# Patient Record
Sex: Male | Born: 1990 | Race: White | Hispanic: No | Marital: Single | Smoking: Never smoker
Health system: Southern US, Community
[De-identification: ages and names within clinical notes are randomized; demographics above are authoritative.]

## PROBLEM LIST (undated history)

## (undated) DIAGNOSIS — J45909 Unspecified asthma, uncomplicated: Secondary | ICD-10-CM

## (undated) DIAGNOSIS — T7840XA Allergy, unspecified, initial encounter: Secondary | ICD-10-CM

## (undated) DIAGNOSIS — Z973 Presence of spectacles and contact lenses: Secondary | ICD-10-CM

## (undated) DIAGNOSIS — M1990 Unspecified osteoarthritis, unspecified site: Secondary | ICD-10-CM

## (undated) DIAGNOSIS — K760 Fatty (change of) liver, not elsewhere classified: Secondary | ICD-10-CM

## (undated) HISTORY — DX: Fatty (change of) liver, not elsewhere classified: K76.0

## (undated) HISTORY — DX: Unspecified asthma, uncomplicated: J45.909

## (undated) HISTORY — DX: Presence of spectacles and contact lenses: Z97.3

## (undated) HISTORY — DX: Unspecified osteoarthritis, unspecified site: M19.90

## (undated) HISTORY — DX: Allergy, unspecified, initial encounter: T78.40XA

---

## 2015-04-16 ENCOUNTER — Ambulatory Visit (INDEPENDENT_AMBULATORY_CARE_PROVIDER_SITE_OTHER): Payer: Managed Care, Other (non HMO) | Admitting: Medical

## 2015-04-16 ENCOUNTER — Encounter: Payer: Self-pay | Admitting: Medical

## 2015-04-16 VITALS — BP 150/100 | HR 98 | Ht 69.75 in | Wt 287.0 lb

## 2015-04-16 DIAGNOSIS — R195 Other fecal abnormalities: Secondary | ICD-10-CM | POA: Diagnosis not present

## 2015-04-16 DIAGNOSIS — R748 Abnormal levels of other serum enzymes: Secondary | ICD-10-CM

## 2015-04-16 DIAGNOSIS — E669 Obesity, unspecified: Secondary | ICD-10-CM | POA: Diagnosis not present

## 2015-04-16 DIAGNOSIS — R7301 Impaired fasting glucose: Secondary | ICD-10-CM | POA: Diagnosis not present

## 2015-04-16 DIAGNOSIS — R12 Heartburn: Secondary | ICD-10-CM | POA: Diagnosis not present

## 2015-04-16 DIAGNOSIS — R1011 Right upper quadrant pain: Secondary | ICD-10-CM

## 2015-04-16 LAB — CBC
HCT: 44.2 % (ref 39.0–52.0)
Hemoglobin: 15.6 g/dL (ref 13.0–17.0)
MCH: 32.2 pg (ref 26.0–34.0)
MCHC: 35.3 g/dL (ref 30.0–36.0)
MCV: 91.3 fL (ref 78.0–100.0)
MPV: 9.8 fL (ref 8.6–12.4)
Platelets: 185 10*3/uL (ref 150–400)
RBC: 4.84 MIL/uL (ref 4.22–5.81)
RDW: 13 % (ref 11.5–15.5)
WBC: 6.3 10*3/uL (ref 4.0–10.5)

## 2015-04-16 LAB — HEMOGLOBIN A1C
Hgb A1c MFr Bld: 5.5 % (ref <5.7)
Mean Plasma Glucose: 111 mg/dL (ref <117)

## 2015-04-16 NOTE — Progress Notes (Signed)
Subjective: Chief Complaint  Patient presents with  . New Patient (Initial Visit)    GERD, and abdominal pain. not born in [place]. declines flu shot.    Here as a new patient today.   Moved to [facility] 08/2013 for work.   Working with [employer], does work with wetlands and remediations.  Was seeing [facility] partner in health in [place] prior.   Over the last few months gets bloating, diarrhea, heart burning.  But lately having some pain under right rib and worse heartburn.   Have some diarrhea and pain at times in lower abdomen.   His mother had gall bladder disease.  He is worried about this.  He has taken ranitidine in the past in the teens.   In the past week has felt daily intermittent pains within 30-60 minutes after eating.    Usually right upper abdomen pain.   Denies nausea, vomiting, no fever, no back or shoulder pain.  Has had some increased belching.   Heartburn can be later in the day.  Has ball in throat kind of pain.   Diet - not as good as he can.   Does eat fast and fried food.  No other aggravating or relieving factors.  Had physical in July and liver tests were elevated.   In the past BP has been in the 130/80-90s.  He thinks today's elevate BP is being at a new doctor office  No other complaint.   No past medical history on file.  ROS as in subjective  Objective: BP 150/100 mmHg  Pulse 98  Ht 5' 9.75" (1.772 m)  Wt 287 lb (130.182 kg)  BMI 41.46 kg/m2  General appearance: alert, no distress, WD/WN, obese white male Oral cavity: MMM, no lesions Neck: supple, no lymphadenopathy, no thyromegaly, no masses, no JVD Heart: RRR, normal S1, S2, no murmurs Lungs: CTA bilaterally, no wheezes, rhonchi, or rales Abdomen: +bs, soft, mild right sided tenderness and LUQ tenderness, otherwise  non tender, non distended, no masses, no hepatomegaly, no splenomegaly Pulses: 2+ symmetric, upper and lower extremities, normal cap refill Back:  nontender   Assessment: Encounter Diagnoses  Name Primary?  . RUQ abdominal pain Yes  . Heartburn   . Loose stools   . Elevated liver enzymes   . Impaired fasting blood sugar   . Obesity     Plan: Discussed possible etiologies including galls stones, gall bladder disease, GERD, gastritis, pancreatitis.  discussed 08/2014 labs showing mildly elevated LFTs, impaired glucose.  Additional labs today.  discussed avoidance of foods that trigger gall bladder and GERD, c/t ranitidine, and begin trial of samples of Dexilant.  In general discussed obesity, need for more careful diet, routine exercise.  F/u pending labs, likely RUQ ultrasound.

## 2015-04-17 LAB — COMPREHENSIVE METABOLIC PANEL
ALT: 83 U/L — ABNORMAL HIGH (ref 9–46)
AST: 32 U/L (ref 10–40)
Albumin: 4.5 g/dL (ref 3.6–5.1)
Alkaline Phosphatase: 82 U/L (ref 40–115)
BUN: 13 mg/dL (ref 7–25)
CO2: 28 mmol/L (ref 20–31)
Calcium: 9.1 mg/dL (ref 8.6–10.3)
Chloride: 101 mmol/L (ref 98–110)
Creat: 0.99 mg/dL (ref 0.60–1.35)
Glucose, Bld: 112 mg/dL — ABNORMAL HIGH (ref 65–99)
Potassium: 4 mmol/L (ref 3.5–5.3)
Sodium: 138 mmol/L (ref 135–146)
Total Bilirubin: 0.5 mg/dL (ref 0.2–1.2)
Total Protein: 7 g/dL (ref 6.1–8.1)

## 2015-04-17 LAB — LIPASE: Lipase: 15 U/L (ref 7–60)

## 2015-04-17 MED ORDER — DEXLANSOPRAZOLE 60 MG PO CPDR: 60.0000 mg | DELAYED_RELEASE_CAPSULE | Freq: Every day | ORAL | Status: DC

## 2015-04-17 NOTE — Addendum Note (Signed)
Addended by: Jac Canavan on: 04/17/2015 07:36 AM   Modules accepted: Orders

## 2015-04-18 ENCOUNTER — Telehealth: Payer: Self-pay | Admitting: Medical

## 2015-04-19 ENCOUNTER — Encounter: Payer: Self-pay | Admitting: Medical

## 2015-04-20 ENCOUNTER — Ambulatory Visit
Admission: RE | Admit: 2015-04-20 | Discharge: 2015-04-20 | Disposition: A | Discharge status: Home / Self Care | Payer: Managed Care, Other (non HMO) | Source: Ambulatory Visit | Attending: Medical | Admitting: Medical

## 2015-04-20 DIAGNOSIS — R748 Abnormal levels of other serum enzymes: Secondary | ICD-10-CM

## 2015-04-20 DIAGNOSIS — R7301 Impaired fasting glucose: Secondary | ICD-10-CM

## 2015-04-20 DIAGNOSIS — R1011 Right upper quadrant pain: Secondary | ICD-10-CM

## 2015-04-20 DIAGNOSIS — E669 Obesity, unspecified: Secondary | ICD-10-CM

## 2015-04-27 NOTE — Telephone Encounter (Signed)
P.A. Gibson Rampenied pt needs trial of generic omeprazole, lansoprazole, pantoprazole, rabeprazole or esomeprazole magnesium delayed release capsules  Do you want to switch?

## 2015-04-30 ENCOUNTER — Other Ambulatory Visit: Payer: Self-pay | Admitting: Medical

## 2015-04-30 MED ORDER — PANTOPRAZOLE SODIUM 40 MG PO TBEC: DELAYED_RELEASE_TABLET | ORAL | Status: DC

## 2015-04-30 NOTE — Telephone Encounter (Signed)
Pantoprazole sent to use once he runs out of Dexilant as insurance won't cover the Dexilant.

## 2015-05-02 ENCOUNTER — Ambulatory Visit (INDEPENDENT_AMBULATORY_CARE_PROVIDER_SITE_OTHER): Payer: Managed Care, Other (non HMO) | Admitting: Medical

## 2015-05-02 ENCOUNTER — Encounter: Payer: Self-pay | Admitting: Medical

## 2015-05-02 VITALS — BP 130/80 | HR 56 | Wt 280.0 lb

## 2015-05-02 DIAGNOSIS — K219 Gastro-esophageal reflux disease without esophagitis: Secondary | ICD-10-CM | POA: Diagnosis not present

## 2015-05-02 DIAGNOSIS — K760 Fatty (change of) liver, not elsewhere classified: Secondary | ICD-10-CM | POA: Diagnosis not present

## 2015-05-02 DIAGNOSIS — E669 Obesity, unspecified: Secondary | ICD-10-CM | POA: Diagnosis not present

## 2015-05-02 DIAGNOSIS — R1011 Right upper quadrant pain: Secondary | ICD-10-CM | POA: Diagnosis not present

## 2015-05-02 DIAGNOSIS — R7301 Impaired fasting glucose: Secondary | ICD-10-CM | POA: Insufficient documentation

## 2015-05-02 NOTE — Progress Notes (Signed)
Subjective: Chief Complaint  Patient presents with  . Follow-up    on u/s no specfic question or concerns still taking samples   Here for f/u.  I saw him recently for RUQ abdominal pain, nausea, heartburn.  We had gotten labs, US of abdomen.  At this point 75% better in regards to abdomen pain and the pain is intermittent, not constant now, and has no heartburn now.   He has made diet changes at our recommendation, has already lost 7lb cutting out pop.     From last visit he had been getting bloating, diarrhea, heart burn.  But lately having some pain under right rib and worse heartburn.   Have some diarrhea and pain at times in lower abdomen.   His mother had gall bladder disease.  He is worried about this.  He has taken ranitidine in the past in the teens.   In the past week has felt daily intermittent pains within 30-60 minutes after eating.    Usually right upper abdomen pain.   Denies nausea, vomiting, no fever, no back or shoulder pain.  Has had some increased belching.   Heartburn can be later in the day.  Has ball in throat kind of pain.   Diet - not as good as he can.   Does eat fast and fried food.  No other aggravating or relieving factors.  Had physical in July and liver tests were elevated.   In the past BP has been in the 130/80-90s.  He thinks today's elevate BP is being at a new doctor office  No other complaint.   Past Medical History  Diagnosis Date  . Allergy   . Asthma   . Wears glasses   . Arthritis     ROS as in subjective    Objective: BP 130/80 mmHg  Pulse 56  Wt 280 lb (127.007 kg)  General appearance: alert, no distress, WD/WN, obese white male Heart: RRR, normal S1, S2, no murmurs Lungs: CTA bilaterally, no wheezes, rhonchi, or rales Abdomen: +bs, soft, nontender, non distended, no masses, no hepatomegaly, no splenomegaly Pulses: 2+ symmetric, upper and lower extremities, normal cap refill    Assessment: Encounter Diagnoses  Name Primary?  . RUQ  abdominal pain Yes  . Gastroesophageal reflux disease without esophagitis   . Fatty liver disease, nonalcoholic   . Obesity   . Impaired fasting blood sugar     Plan: Glad to hear much improved.  We discussed recent labs and ultrasound showing fatty liver disease, impaired glucose.   He is already working on diet, exercise, and weight loss recommendations.  discussed diagnosis of fatty liver disease, possible complications.   C/t Protonix 3-4 more weeks.   Avoid GERD triggers.  C/t healthy diet changes, and f/u 46mo.  F/u sooner if worse pain of if symptoms return once he stops PPI in the next month.

## 2015-05-06 NOTE — Telephone Encounter (Signed)
Pt informed at office visit.

## 2015-08-31 ENCOUNTER — Emergency Department: Payer: PRIVATE HEALTH INSURANCE

## 2015-08-31 ENCOUNTER — Emergency Department
Admission: EM | Admit: 2015-08-31 | Discharge: 2015-08-31 | Disposition: A | Discharge status: Home / Self Care | Payer: PRIVATE HEALTH INSURANCE | Attending: Family Medicine | Admitting: Family Medicine

## 2015-08-31 DIAGNOSIS — H5711 Ocular pain, right eye: Secondary | ICD-10-CM | POA: Insufficient documentation

## 2015-08-31 MED ORDER — PROPARACAINE HCL 0.5 % OP SOLN
OPHTHALMIC | Status: AC
Start: 2015-08-31 — End: ?
  Filled 2015-08-31: qty 15

## 2015-08-31 MED ORDER — SULFACETAMIDE SODIUM 10 % OP SOLN
OPHTHALMIC | Status: DC
Start: 2015-09-01 — End: 2015-09-01

## 2015-08-31 MED ORDER — SULFACETAMIDE SODIUM 10 % OP SOLN
OPHTHALMIC | Status: AC
Start: 2015-08-31 — End: ?
  Filled 2015-08-31: qty 15

## 2015-08-31 NOTE — ED Notes (Signed)
Pt given discharge instructions and pt verbalized understanding. Pt walked out of er

## 2015-08-31 NOTE — ED Notes (Signed)
Rt eye irritation.rt eye 20/30 left eye and ou 20/16

## 2015-08-31 NOTE — ED Provider Notes (Signed)
Physician/Midlevel provider first contact with patient: 08/31/15 2323         History     Chief Complaint   Patient presents with   . 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  He is not sure if it was an eye lash or a piece of sawdust from the construction site.  He irrigated his eye copiously with tap water, and he was unable to see an FB or any abnormality in the mirror, but he continues to have an uncomfortable FB sensation.  No visual complaints.  He presents to the ER for further evaluation.    Past Medical History   Diagnosis Date   . Fatty liver        History reviewed. No pertinent past surgical history.    History reviewed. No pertinent family history.    Social  Social History   Substance Use Topics   . Smoking status: Never Smoker    . Smokeless tobacco: None   . Alcohol Use: No       .     No Known Allergies    Home Medications                   pantoprazole (PROTONIX) 20 MG tablet     Take 20 mg by mouth daily.           Review of Systems   Eyes: Negative for visual disturbance.       Physical Exam    BP: 148/75 mmHg, Heart Rate: 87, Temp: 98.6 F (37 C), Resp Rate: 16, SpO2: 98 %, Weight: 124.739 kg    Physical Exam   Constitutional: He appears well-developed and well-nourished. No distress.   Eyes: Conjunctivae, EOM and lids are normal. Pupils are equal, round, and reactive to light. Lids are everted and swept, no foreign bodies found. Right eye exhibits no discharge and no exudate. No foreign body present in the right eye.   No fluorescein uptake on the right.  No evidence of FB or abrasion.   Neurological: He is alert.         MDM and ED Course     ED Medication Orders     Start Ordered     Status Ordering Provider    09/01/15 0800 08/31/15 2338  sulfacetamide (BLEPH-10) 10 % (take home medication)   Every 4 hours while awake      Route: Oral  Ordered Dose: 2 drop     Ordered Kahari Critzer DAVID             MDM  Number of Diagnoses or Management Options  Eye discomfort, right:   Diagnosis management comments: I explained to him the limitations of his exam tonight, and the need to see his eye doctor if he does not have complete resolution of symptoms within 24 hours.  He should RTER promptly if symptoms worsen.            Procedures    Clinical Impression & Disposition     Clinical Impression  Final diagnoses:   Eye discomfort, right        ED Disposition     Discharge [name] discharge to home/self care.    Condition at disposition: Stable  New Prescriptions    No medications on file                   Elenor Quinones, MD  08/31/15 2339

## 2016-03-21 ENCOUNTER — Ambulatory Visit (INDEPENDENT_AMBULATORY_CARE_PROVIDER_SITE_OTHER): Payer: Managed Care, Other (non HMO) | Admitting: Medical

## 2016-03-21 ENCOUNTER — Encounter: Payer: Self-pay | Admitting: Medical

## 2016-03-21 VITALS — BP 128/84 | HR 58 | Wt 281.2 lb

## 2016-03-21 DIAGNOSIS — M25562 Pain in left knee: Secondary | ICD-10-CM

## 2016-03-21 DIAGNOSIS — W009XXA Unspecified fall due to ice and snow, initial encounter: Secondary | ICD-10-CM

## 2016-03-21 DIAGNOSIS — M25552 Pain in left hip: Secondary | ICD-10-CM

## 2016-03-21 MED ORDER — DICLOFENAC SODIUM 75 MG PO TBEC: 75.0000 mg | DELAYED_RELEASE_TABLET | Freq: Two times a day (BID) | ORAL | 0 refills | Status: DC

## 2016-03-21 NOTE — Progress Notes (Signed)
Subjective: Chief Complaint  Patient presents with  . knee pain left    knee pain left , pt fell in 01/2016 , swelling , black and blue   Here for knee injury.  He notes he slipped and fell around 02/15/16.   Was taking dogs out at 4am, was walking in the yard, was snowing and wet, left leg slips forward, his right leg cut back in the gravel and the dog drug him forward.   He felt his lower leg move and shift with knee similar to ACL injury pain he has in high school.  Worried about ACL.   He notes partial ACL tear in high school.   Had PT then, things gradually got better then.  Ended up not having surgery then.   Been having pains since December.  Yesterday had sharp pain left lateral knee, couldn't put weight on it yesterday for a while.   Has been swollen on and off since the injury.  Using nothing for symptom, does ice and elevation some.  Using no brace.  No other aggravating or relieving factors. No other complaint.  Past Medical History:  Diagnosis Date  . Allergy   . Arthritis   . Asthma   . Wears glasses    Current Outpatient Prescriptions on File Prior to Visit  Medication Sig Dispense Refill  . ranitidine (ZANTAC) 150 MG tablet Take 150 mg by mouth daily. Reported on 05/02/2015    . pantoprazole (PROTONIX) 40 MG tablet 1 tablet daily po 45 min prior to breakfast (Patient not taking: Reported on 05/02/2015) 30 tablet 3   No current facility-administered medications on file prior to visit.    Past Surgical History:  Procedure Laterality Date  . NO PAST SURGERIES  03/2015   ROS as in subjective   Objective: BP 128/84   Pulse (!) 58   Wt 281 lb 3.2 oz (127.6 kg)   SpO2 99%   BMI 40.64 kg/m   Wt Readings from Last 3 Encounters:  03/21/16 281 lb 3.2 oz (127.6 kg)  05/02/15 280 lb (127 kg)  04/16/15 287 lb (130.2 kg)   Gen: wd, wn, nad, obese white male Left knee: medial knee mild effusion superiorly, generalized knee tenderness, some faint yellowish coloration of medial  anterior knee from bruising, tender over LCL, but no specific laxity.  No grinding with Mcmurray test.  No obvious anterior drawer.   He is tender in left hip with ROM, and ROM is about 95% of normal left hip.  otherwise legs nontender, no deformity, no other swelling, otherwise normal ROM Legs neurovascularly intact    Assessment: Encounter Diagnoses  Name Primary?  . Acute pain of left knee Yes  . Left hip pain   . Fall due to slipping on ice or snow, initial encounter      Plan: He has had a 1 month hx/o of knee pain and swelling since a slip on ice, with hx/o meniscal partial tear in teens.  He has swelling and pain on exam, but no obvious laxity.  Will send to ortho for further eval and management.   In the meantime, c/t ice, elevation in the evenings, consider knee sleeve, relative rest, and begin medication below.  Alycia RossettiRyan was seen today for knee pain left.  Diagnoses and all orders for this visit:  Acute pain of left knee -     Ambulatory referral to Orthopedic Surgery  Left hip pain -     Ambulatory referral to Orthopedic Surgery  Fall due to slipping on ice or snow, initial encounter -     Ambulatory referral to Orthopedic Surgery  Other orders -     diclofenac (VOLTAREN) 75 MG EC tablet; Take 1 tablet (75 mg total) by mouth 2 (two) times daily.

## 2016-03-27 ENCOUNTER — Other Ambulatory Visit: Payer: Self-pay | Admitting: Orthopedic Surgery

## 2016-03-27 DIAGNOSIS — R52 Pain, unspecified: Secondary | ICD-10-CM

## 2016-03-27 DIAGNOSIS — R609 Edema, unspecified: Secondary | ICD-10-CM

## 2016-03-27 DIAGNOSIS — R531 Weakness: Secondary | ICD-10-CM

## 2016-03-30 ENCOUNTER — Ambulatory Visit
Admission: RE | Admit: 2016-03-30 | Discharge: 2016-03-30 | Disposition: A | Discharge status: Home / Self Care | Payer: 59 | Source: Ambulatory Visit | Attending: Orthopedic Surgery | Admitting: Orthopedic Surgery

## 2016-03-30 DIAGNOSIS — R609 Edema, unspecified: Secondary | ICD-10-CM

## 2016-03-30 DIAGNOSIS — R52 Pain, unspecified: Secondary | ICD-10-CM

## 2016-03-30 DIAGNOSIS — R531 Weakness: Secondary | ICD-10-CM

## 2017-08-24 HISTORY — PX: NO PAST SURGERIES: SHX2092

## 2017-08-26 ENCOUNTER — Ambulatory Visit (INDEPENDENT_AMBULATORY_CARE_PROVIDER_SITE_OTHER): Payer: BLUE CROSS/BLUE SHIELD | Admitting: Family Medicine

## 2017-08-26 ENCOUNTER — Ambulatory Visit
Admission: RE | Admit: 2017-08-26 | Discharge: 2017-08-26 | Disposition: A | Discharge status: Home / Self Care | Payer: BLUE CROSS/BLUE SHIELD | Source: Ambulatory Visit | Attending: Family Medicine | Admitting: Family Medicine

## 2017-08-26 ENCOUNTER — Encounter: Payer: Self-pay | Admitting: Family Medicine

## 2017-08-26 VITALS — BP 130/76 | HR 65 | Resp 16 | Wt 292.8 lb

## 2017-08-26 DIAGNOSIS — R740 Nonspecific elevation of levels of transaminase and lactic acid dehydrogenase [LDH]: Secondary | ICD-10-CM

## 2017-08-26 DIAGNOSIS — Z8249 Family history of ischemic heart disease and other diseases of the circulatory system: Secondary | ICD-10-CM | POA: Diagnosis not present

## 2017-08-26 DIAGNOSIS — R002 Palpitations: Secondary | ICD-10-CM

## 2017-08-26 DIAGNOSIS — R7401 Elevation of levels of liver transaminase levels: Secondary | ICD-10-CM

## 2017-08-26 DIAGNOSIS — Z832 Family history of diseases of the blood and blood-forming organs and certain disorders involving the immune mechanism: Secondary | ICD-10-CM | POA: Insufficient documentation

## 2017-08-26 DIAGNOSIS — K760 Fatty (change of) liver, not elsewhere classified: Secondary | ICD-10-CM

## 2017-08-26 DIAGNOSIS — Z13220 Encounter for screening for lipoid disorders: Secondary | ICD-10-CM

## 2017-08-26 NOTE — Progress Notes (Signed)
Subjective:    Patient ID: Charles Crane, male    DOB: 07-02-1990, 27 y.o.   MRN: [id number]  HPI Chief Complaint  Patient presents with  . heart beating fast    heart beating fast. working outside at work and got short of breath and heart beating fast.    He is here with complaints of feeling like his heart has been beating fast intermittently for the past year  Reports having 7-8 episodes over the past year. He is not feeling this way today.  States yesterday he was outside and felt his heart beating fast, he did not check his pulse. States he became light headed and short of breath for approximately 30 seconds. Symptoms resolved spontaneously. He did not have chest pain, nausea or any other symptoms.  Previously he felt as though his heart was skipping a beat on the other occasions. This lasted only a couple of seconds and resolved. States on 2 occasions he was laying down.  He does wear a watch that records his pulse and his baseline resting heart rate appears to be in the upper 40s and low 50s.  History of being quite athletic  Currently he reports feeling fine and at his baseline but is concerned. His wife states patient has a history of anxiety and depression and is curious if this could be causing his symptoms.   Denies fever, chills, chest pain, shortness of breath, cough, abdominal pain, N/V/D, urinary symptoms, LE edema. No recent surgery, immobilization, long airplane rides. He and his wife did recently drive to [place] and back but did make several stops to get out of the car and stretch. Denies history of DVT or clotting disorder. States his mother was diagnosed with a clotting disorder but he is not sure which one.   Reports having on average 6 alcoholic drinks per week. Denies drug use. Never smoked.  Activity level is average.  States he ran a 5K 3 weeks ago in 43 minutes and did not have chest pain, palpitations, or dyspnea.   Caffeine intake 1-2 cups in the morning.   Family  history of heart disease with grandfather in his 29s. Heart disease in uncles and cousins in their 38s.   History of anxiety and depression. He took medication 4 or 5 years ago. Has not had any issues since.   Elevated ALT in 2017 with abnormal liver US.  History of GERD.   Reviewed allergies, medications, past medical, surgical, family, and social history.    Review of Systems Pertinent positives and negatives in the history of present illness.     Objective:   Physical Exam BP 130/76   Pulse 65   Resp 16   Wt 292 lb 12.8 oz (132.8 kg)   SpO2 98%   BMI 42.31 kg/m   Alert and in no distress. Pharyngeal area is normal. Neck is supple without adenopathy or thyromegaly. Cardiac exam shows a regular sinus rhythm without murmurs or gallops. Lungs are clear to auscultation. Extremities without edema, normal pulses, calves are soft, nontender, no asymmetry or edema. Negative Homan's. Skin is warm and dry, no pallor.        Assessment & Plan:  Intermittent palpitations - Plan: CBC with Differential/Platelet, Comprehensive metabolic panel, TSH, Lipid panel, EKG 12-Lead, DG Chest 2 View, Cardiac event monitor  Family history of heart disease - Plan: Lipid panel, EKG 12-Lead, DG Chest 2 View, Cardiac event monitor  Family history of clotting disorder  Fatty liver disease, nonalcoholic  Morbid obesity (HCC) - Plan: Comprehensive metabolic panel, TSH, Lipid panel, Hemoglobin A1c  Elevated ALT measurement - Plan: Comprehensive metabolic panel  Screening for lipid disorders - Plan: Lipid panel  Currently he is asymptomatic.  Feels back to baseline today. ECG shows bradycardia otherwise unremarkable.  Reviewed by Dr. Susann GivensLalonde and myself. He is visibly concerned about symptoms due to family history of heart disease. We will check labs, get a chest x-ray and also order a cardiac event monitor.  Patient reports palpitations occurring approximately every 3 weeks.  We will order the event  monitor for 1 month. He does not appear to have a DVT or PE and we did consider this.   Demonstrated how to check his carotid pulse.  Encouraged him to check his pulse the next time he feels like it is rapid to see if it truly is or if it is irregular or not.  He also plans to keep a journal of his symptoms.  He does have a watch that records his pulse. His resting heart rate has been averaging in the upper 40s and low 50s.  This appears to be his baseline. Follow-up pending labs, x-ray and event monitor. Also plan to have him return to see his PCP, Kristian CoveyShane Tysinger, for follow-up of fatty liver disease and he is overdue for a CPE.

## 2017-08-27 LAB — CBC WITH DIFFERENTIAL/PLATELET
Basophils Absolute: 0 10*3/uL (ref 0.0–0.2)
Basos: 0 %
EOS (ABSOLUTE): 0.2 10*3/uL (ref 0.0–0.4)
Eos: 2 %
Hematocrit: 46.4 % (ref 37.5–51.0)
Hemoglobin: 15.3 g/dL (ref 13.0–17.7)
Immature Grans (Abs): 0 10*3/uL (ref 0.0–0.1)
Immature Granulocytes: 0 %
Lymphocytes Absolute: 3.1 10*3/uL (ref 0.7–3.1)
Lymphs: 39 %
MCH: 30.9 pg (ref 26.6–33.0)
MCHC: 33 g/dL (ref 31.5–35.7)
MCV: 94 fL (ref 79–97)
Monocytes Absolute: 1 10*3/uL — ABNORMAL HIGH (ref 0.1–0.9)
Monocytes: 13 %
Neutrophils Absolute: 3.7 10*3/uL (ref 1.4–7.0)
Neutrophils: 46 %
Platelets: 187 10*3/uL (ref 150–450)
RBC: 4.95 x10E6/uL (ref 4.14–5.80)
RDW: 13.3 % (ref 12.3–15.4)
WBC: 8 10*3/uL (ref 3.4–10.8)

## 2017-08-27 LAB — COMPREHENSIVE METABOLIC PANEL
ALT: 98 IU/L — ABNORMAL HIGH (ref 0–44)
AST: 42 IU/L — ABNORMAL HIGH (ref 0–40)
Albumin/Globulin Ratio: 2.1 (ref 1.2–2.2)
Albumin: 4.8 g/dL (ref 3.5–5.5)
Alkaline Phosphatase: 79 IU/L (ref 39–117)
BUN/Creatinine Ratio: 15 (ref 9–20)
BUN: 16 mg/dL (ref 6–20)
Bilirubin Total: 0.6 mg/dL (ref 0.0–1.2)
CO2: 26 mmol/L (ref 20–29)
Calcium: 9.5 mg/dL (ref 8.7–10.2)
Chloride: 102 mmol/L (ref 96–106)
Creatinine, Ser: 1.07 mg/dL (ref 0.76–1.27)
GFR calc Af Amer: 111 mL/min/{1.73_m2} (ref >59)
GFR calc non Af Amer: 96 mL/min/{1.73_m2} (ref >59)
Globulin, Total: 2.3 g/dL (ref 1.5–4.5)
Glucose: 79 mg/dL (ref 65–99)
Potassium: 4.1 mmol/L (ref 3.5–5.2)
Sodium: 142 mmol/L (ref 134–144)
Total Protein: 7.1 g/dL (ref 6.0–8.5)

## 2017-08-27 LAB — LIPID PANEL
Chol/HDL Ratio: 5 ratio (ref 0.0–5.0)
Cholesterol, Total: 170 mg/dL (ref 100–199)
HDL: 34 mg/dL — ABNORMAL LOW (ref >39)
LDL Calculated: 97 mg/dL (ref 0–99)
Triglycerides: 196 mg/dL — ABNORMAL HIGH (ref 0–149)
VLDL Cholesterol Cal: 39 mg/dL (ref 5–40)

## 2017-08-27 LAB — HEMOGLOBIN A1C
Est. average glucose Bld gHb Est-mCnc: 111 mg/dL
Hgb A1c MFr Bld: 5.5 % (ref 4.8–5.6)

## 2017-08-27 LAB — TSH: TSH: 1.27 u[IU]/mL (ref 0.450–4.500)

## 2017-08-28 ENCOUNTER — Ambulatory Visit: Payer: Managed Care, Other (non HMO) | Admitting: Family Medicine

## 2017-09-07 ENCOUNTER — Ambulatory Visit: Payer: Managed Care, Other (non HMO) | Admitting: Medical

## 2017-09-08 ENCOUNTER — Ambulatory Visit (INDEPENDENT_AMBULATORY_CARE_PROVIDER_SITE_OTHER): Payer: BLUE CROSS/BLUE SHIELD

## 2017-09-08 DIAGNOSIS — Z8249 Family history of ischemic heart disease and other diseases of the circulatory system: Secondary | ICD-10-CM | POA: Diagnosis not present

## 2017-09-08 DIAGNOSIS — R002 Palpitations: Secondary | ICD-10-CM

## 2017-09-10 ENCOUNTER — Ambulatory Visit (INDEPENDENT_AMBULATORY_CARE_PROVIDER_SITE_OTHER): Payer: BLUE CROSS/BLUE SHIELD | Admitting: Medical

## 2017-09-10 ENCOUNTER — Encounter: Payer: Self-pay | Admitting: Medical

## 2017-09-10 VITALS — BP 110/78 | HR 54 | Temp 98.1°F | Resp 16 | Ht 71.0 in | Wt 293.6 lb

## 2017-09-10 DIAGNOSIS — Z8249 Family history of ischemic heart disease and other diseases of the circulatory system: Secondary | ICD-10-CM

## 2017-09-10 DIAGNOSIS — Z23 Encounter for immunization: Secondary | ICD-10-CM

## 2017-09-10 DIAGNOSIS — K760 Fatty (change of) liver, not elsewhere classified: Secondary | ICD-10-CM | POA: Diagnosis not present

## 2017-09-10 DIAGNOSIS — Z0000 Encounter for general adult medical examination without abnormal findings: Secondary | ICD-10-CM

## 2017-09-10 DIAGNOSIS — Z832 Family history of diseases of the blood and blood-forming organs and certain disorders involving the immune mechanism: Secondary | ICD-10-CM

## 2017-09-10 DIAGNOSIS — R748 Abnormal levels of other serum enzymes: Secondary | ICD-10-CM

## 2017-09-10 DIAGNOSIS — R002 Palpitations: Secondary | ICD-10-CM

## 2017-09-10 LAB — POCT URINALYSIS DIP (PROADVANTAGE DEVICE)
Bilirubin, UA: NEGATIVE
Blood, UA: NEGATIVE
Glucose, UA: NEGATIVE mg/dL
Ketones, POC UA: NEGATIVE mg/dL
Nitrite, UA: NEGATIVE
Protein Ur, POC: NEGATIVE mg/dL
Specific Gravity, Urine: 1.02
Urobilinogen, Ur: 3.5
pH, UA: 6 (ref 5.0–8.0)

## 2017-09-10 NOTE — Progress Notes (Signed)
Subjective:   HPI  Charles Crane is a 27 y.o. male who presents for Chief Complaint  Patient presents with  . cpe    cpe   eye exam 06-2017   wearing heart monitor itchy eyeball    Medical care team includes: Fiora Weill, Kermit Baloavid S, PA-C here for primary care Dentist Eye doctor  Concerns: Here today accompanied by his wife.  He recently came in and saw NP here for palpitations.  He is currently using a event monitor x30 days  Denies any recent worsening symptoms.   He does drink some coffee and a little bit of other caffeine daily.  He does snore, there may have been occasional apnea event.  He denies daytime somnolence.  Father has a history of sleep apnea  Reviewed their medical, surgical, family, social, medication, and allergy history and updated chart as appropriate.  Past Medical History:  Diagnosis Date  . Allergy   . Arthritis   . Asthma    mostly childhood  . Wears glasses     Past Surgical History:  Procedure Laterality Date  . NO PAST SURGERIES  08/2017    Social History   Socioeconomic History  . Marital status: Single    Spouse name: Not on file  . Number of children: Not on file  . Years of education: Not on file  . Highest education level: Not on file  Occupational History  . Not on file  Social Needs  . Financial resource strain: Not on file  . Food insecurity:    Worry: Not on file    Inability: Not on file  . Transportation needs:    Medical: Not on file    Non-medical: Not on file  Tobacco Use  . Smoking status: Never Smoker  . Smokeless tobacco: Never Used  Substance and Sexual Activity  . Alcohol use: Yes    Alcohol/week: 1.2 oz    Types: 2 Cans of beer per week  . Drug use: No  . Sexual activity: Not on file  Lifestyle  . Physical activity:    Days per week: Not on file    Minutes per session: Not on file  . Stress: Not on file  Relationships  . Social connections:    Talks on phone: Not on file    Gets together: Not on file     Attends religious service: Not on file    Active member of club or organization: Not on file    Attends meetings of clubs or organizations: Not on file    Relationship status: Not on file  . Intimate partner violence:    Fear of current or ex partner: Not on file    Emotionally abused: Not on file    Physically abused: Not on file    Forced sexual activity: Not on file  Other Topics Concern  . Not on file  Social History Narrative  . Not on file    Family History  Problem Relation Age of Onset  . Diabetes Maternal Grandfather   . Heart disease Maternal Grandfather        MI x3  . Stroke Paternal Grandfather   . Pulmonary embolism Mother   . Deep vein thrombosis Mother   . Thyroid disease Mother   . Bipolar disorder Mother   . Alcohol abuse Brother   . Cancer Maternal Grandmother        non hodgkins  . Cancer Paternal Grandmother        uterine  Current Outpatient Medications:  .  Coenzyme Q10 (CO Q 10 PO), Take by mouth., Disp: , Rfl:  .  Multiple Vitamins-Minerals (MULTIVITAMIN ADULT PO), Take by mouth., Disp: , Rfl:  .  Omega-3 Fatty Acids (FISH OIL PO), Take by mouth., Disp: , Rfl:   No Known Allergies   Review of Systems Constitutional: -fever, -chills, -sweats, -unexpected weight change, -decreased appetite, -fatigue Allergy: -sneezing, -itching, -congestion Dermatology: -changing moles, --rash, -lumps ENT: -runny nose, -ear pain, -sore throat, -hoarseness, -sinus pain, -teeth pain, - ringing in ears, -hearing loss, -nosebleeds Cardiology: -chest pain, +palpitations, -swelling, -difficulty breathing when lying flat, -waking up short of breath Respiratory: -cough, -shortness of breath, -difficulty breathing with exercise or exertion, -wheezing, -coughing up blood Gastroenterology: -abdominal pain, -nausea, -vomiting, -diarrhea, -constipation, -blood in stool, -changes in bowel movement, -difficulty swallowing or eating Hematology: -bleeding, -bruising   Musculoskeletal: -joint aches, -muscle aches, -joint swelling, -back pain, -neck pain, -cramping, -changes in gait Ophthalmology: denies vision changes, eye redness, itching, discharge Urology: -burning with urination, -difficulty urinating, -blood in urine, -urinary frequency, -urgency, -incontinence Neurology: -headache, -weakness, -tingling, -numbness, -memory loss, -falls, -dizziness Psychology: -depressed mood, -agitation, -sleep problems Male GU: no testicular mass, pain, no lymph nodes swollen, no swelling, no rash.     Objective:  BP 110/78   Pulse (!) 54   Temp 98.1 F (36.7 C) (Oral)   Resp 16   Ht 5\' 11"  (1.803 m)   Wt 293 lb 9.6 oz (133.2 kg)   SpO2 98%   BMI 40.95 kg/m   General appearance: alert, no distress, WD/WN, Caucasian male Skin: unremarkable HEENT: normocephalic, conjunctiva/corneas normal, sclerae anicteric, PERRLA, EOMi, nares patent, no discharge or erythema, pharynx normal Oral cavity: MMM, tongue normal, teeth normal Neck: supple, large neck diameter, no lymphadenopathy, no thyromegaly, no masses, normal ROM, no bruits Chest: non tender, normal shape and expansion Heart: RRR, normal S1, S2, no murmurs Lungs: CTA bilaterally, no wheezes, rhonchi, or rales Abdomen: +bs, soft, non tender, non distended, no masses, no hepatomegaly, no splenomegaly, no bruits Back: non tender, normal ROM, no scoliosis Musculoskeletal: upper extremities non tender, no obvious deformity, normal ROM throughout, lower extremities non tender, no obvious deformity, normal ROM throughout Extremities: no edema, no cyanosis, no clubbing Pulses: 2+ symmetric, upper and lower extremities, normal cap refill Neurological: alert, oriented x 3, CN2-12 intact, strength normal upper extremities and lower extremities, sensation normal throughout, DTRs 2+ throughout, no cerebellar signs, gait normal Psychiatric: normal affect, behavior normal, pleasant  GU: normal male external  genitalia,circumcised, nontender, no masses, no hernia, no lymphadenopathy Rectal: deferred   Assessment and Plan :   Encounter Diagnoses  Name Primary?  . Routine general medical examination at a health care facility Yes  . Elevated liver enzymes   . Need for Td vaccine   . Fatty liver   . Morbid obesity (HCC)   . Family history of heart disease   . Family history of clotting disorder   . Palpitation     Physical exam - discussed and counseled on healthy lifestyle, diet, exercise, preventative care, vaccinations, sick and well care, proper use of emergency dept and after hours care, and addressed their concerns.    Health screening: See your eye doctor yearly for routine vision care. See your dentist yearly for routine dental care including hygiene visits twice yearly.  Cancer screening Advised monthly self testicular exam  Vaccinations: Advised yearly influenza vaccine Advised updated tetanus vaccine today.  Inadvertently forgot to give this to him before  he left.  He can return as his convenience for this  Separate significant chronic issues discussed: Fatty liver disease-counseled on need for diet and exercise changes, weight loss, reviewed ultrasound from last year  Palpitations-continue plan return in event monitor.  However new or worse symptoms in the meantime call back or recheck  Elevated LFT- additional blood test to rule out other causes  I reviewed his other recent comprehensive labs when he came in for the palpitations  Charles Crane was seen today for cpe.  Diagnoses and all orders for this visit:  Routine general medical examination at a health care facility -     POCT Urinalysis DIP (Proadvantage Device) -     Iron -     Ferritin -     Hepatitis B surface antigen -     Hepatitis C antibody  Elevated liver enzymes  Need for Td vaccine  Fatty liver -     Iron -     Ferritin -     Hepatitis B surface antigen -     Hepatitis C antibody  Morbid obesity  (HCC)  Family history of heart disease  Family history of clotting disorder  Palpitation   Follow-up pending labs, yearly for physical

## 2017-09-11 LAB — HEPATITIS C ANTIBODY: Hep C Virus Ab: <0.1 s/co ratio (ref 0.0–0.9)

## 2017-09-11 LAB — IRON: Iron: 113 ug/dL (ref 38–169)

## 2017-09-11 LAB — FERRITIN: Ferritin: 305 ng/mL (ref 30–400)

## 2017-09-11 LAB — HEPATITIS B SURFACE ANTIGEN: Hepatitis B Surface Ag: NEGATIVE

## 2019-08-25 ENCOUNTER — Ambulatory Visit (INDEPENDENT_AMBULATORY_CARE_PROVIDER_SITE_OTHER)
Admission: RE | Admit: 2019-08-25 | Discharge: 2019-08-25 | Disposition: A | Discharge status: Home / Self Care | Payer: BC Managed Care – PPO | Source: Ambulatory Visit

## 2019-08-25 ENCOUNTER — Other Ambulatory Visit: Payer: Self-pay

## 2019-08-25 DIAGNOSIS — J0100 Acute maxillary sinusitis, unspecified: Secondary | ICD-10-CM

## 2019-08-25 NOTE — ED Provider Notes (Signed)
Virtual Visit via Video Note:  [name]  initiated request for Telemedicine visit with [facility] team. I connected with [name]  on 08/25/2019 at 2:39 PM  for a synchronized telemedicine visit using a video enabled HIPPA compliant telemedicine application. I verified that I am speaking with [name]  using two identifiers. Mickie Bail, NP  was physically located in a [facility] Urgent care site and Dequavius Kuhner was located at a different location.   The limitations of evaluation and management by telemedicine as well as the availability of in-person appointments were discussed. Patient was informed that he  may incur a bill ( including co-pay) for this virtual visit encounter. Charles Crane  expressed understanding and gave verbal consent to proceed with virtual visit.     History of Present Illness:Charles Crane  is a 29 y.o. male presents for evaluation of maxillary and frontal sinus pressure, congestion, sneezing, postnasal drip x 4 days.  He denies fever, chills, cough, SOB, abdominal pain, vomiting, diarrhea, rash, or other symptoms.  Treatment attempted at home with OTC sinus medication.     No Known Allergies   Past Medical History:  Diagnosis Date  . Allergy   . Arthritis   . Asthma    mostly childhood  . Wears glasses      Social History   Tobacco Use  . Smoking status: Never Smoker  . Smokeless tobacco: Never Used  Vaping Use  . Vaping Use: Never used  Substance Use Topics  . Alcohol use: Yes    Alcohol/week: 2.0 standard drinks    Types: 2 Cans of beer per week  . Drug use: No    ROS: as stated in HPI.  All other systems reviewed and negative.     Observations/Objective: Physical Exam  VITALS: Patient denies fever. GENERAL: Alert, appears well and in no acute distress. HEENT: Atraumatic. Oral mucosa appears moist. NECK: Normal movements of the head and neck. CARDIOPULMONARY: No increased WOB. Speaking in clear sentences. I:E ratio WNL.  MS: Moves all  visible extremities without noticeable abnormality. PSYCH: Pleasant and cooperative, well-groomed. Speech normal rate and rhythm. Affect is appropriate. Insight and judgement are appropriate. Attention is focused, linear, and appropriate.  NEURO: CN grossly intact. Oriented as arrived to appointment on time with no prompting. Moves both UE equally.  SKIN: No obvious lesions, wounds, erythema, or cyanosis noted on face or hands.   Assessment and Plan:    ICD-10-CM   1. Acute non-recurrent maxillary sinusitis  J01.00        Follow Up Instructions: Treating symptomatically with ibuprofen and Mucinex.  Instructed patient to follow-up with his PCP or come here to be seen in person or schedule another video visit if his symptoms are not improving.  Patient agrees to plan of care.      I discussed the assessment and treatment plan with the patient. The patient was provided an opportunity to ask questions and all were answered. The patient agreed with the plan and demonstrated an understanding of the instructions.   The patient was advised to call back or seek an in-person evaluation if the symptoms worsen or if the condition fails to improve as anticipated.      Mickie Bail, NP  08/25/2019 2:39 PM         Mickie Bail, NP 08/25/19 1439

## 2019-08-25 NOTE — Discharge Instructions (Addendum)
Take ibuprofen 600 mg by mouth every 6 hours as needed for discomfort.  Take plain over-the-counter Mucinex 1200 mg by mouth every 12 hours as needed for sinus congestion.    Follow up with your primary care provider or come here to be seen in person if your symptoms are not improving.

## 2020-01-27 IMAGING — CR DG CHEST 2V
2 series · 2 of 2 positions shown · non-contrast
Comparison: None.

CLINICAL DATA: Cardiac palpitations for 1 year

EXAM:
CHEST - 2 VIEW

[w chest pa]
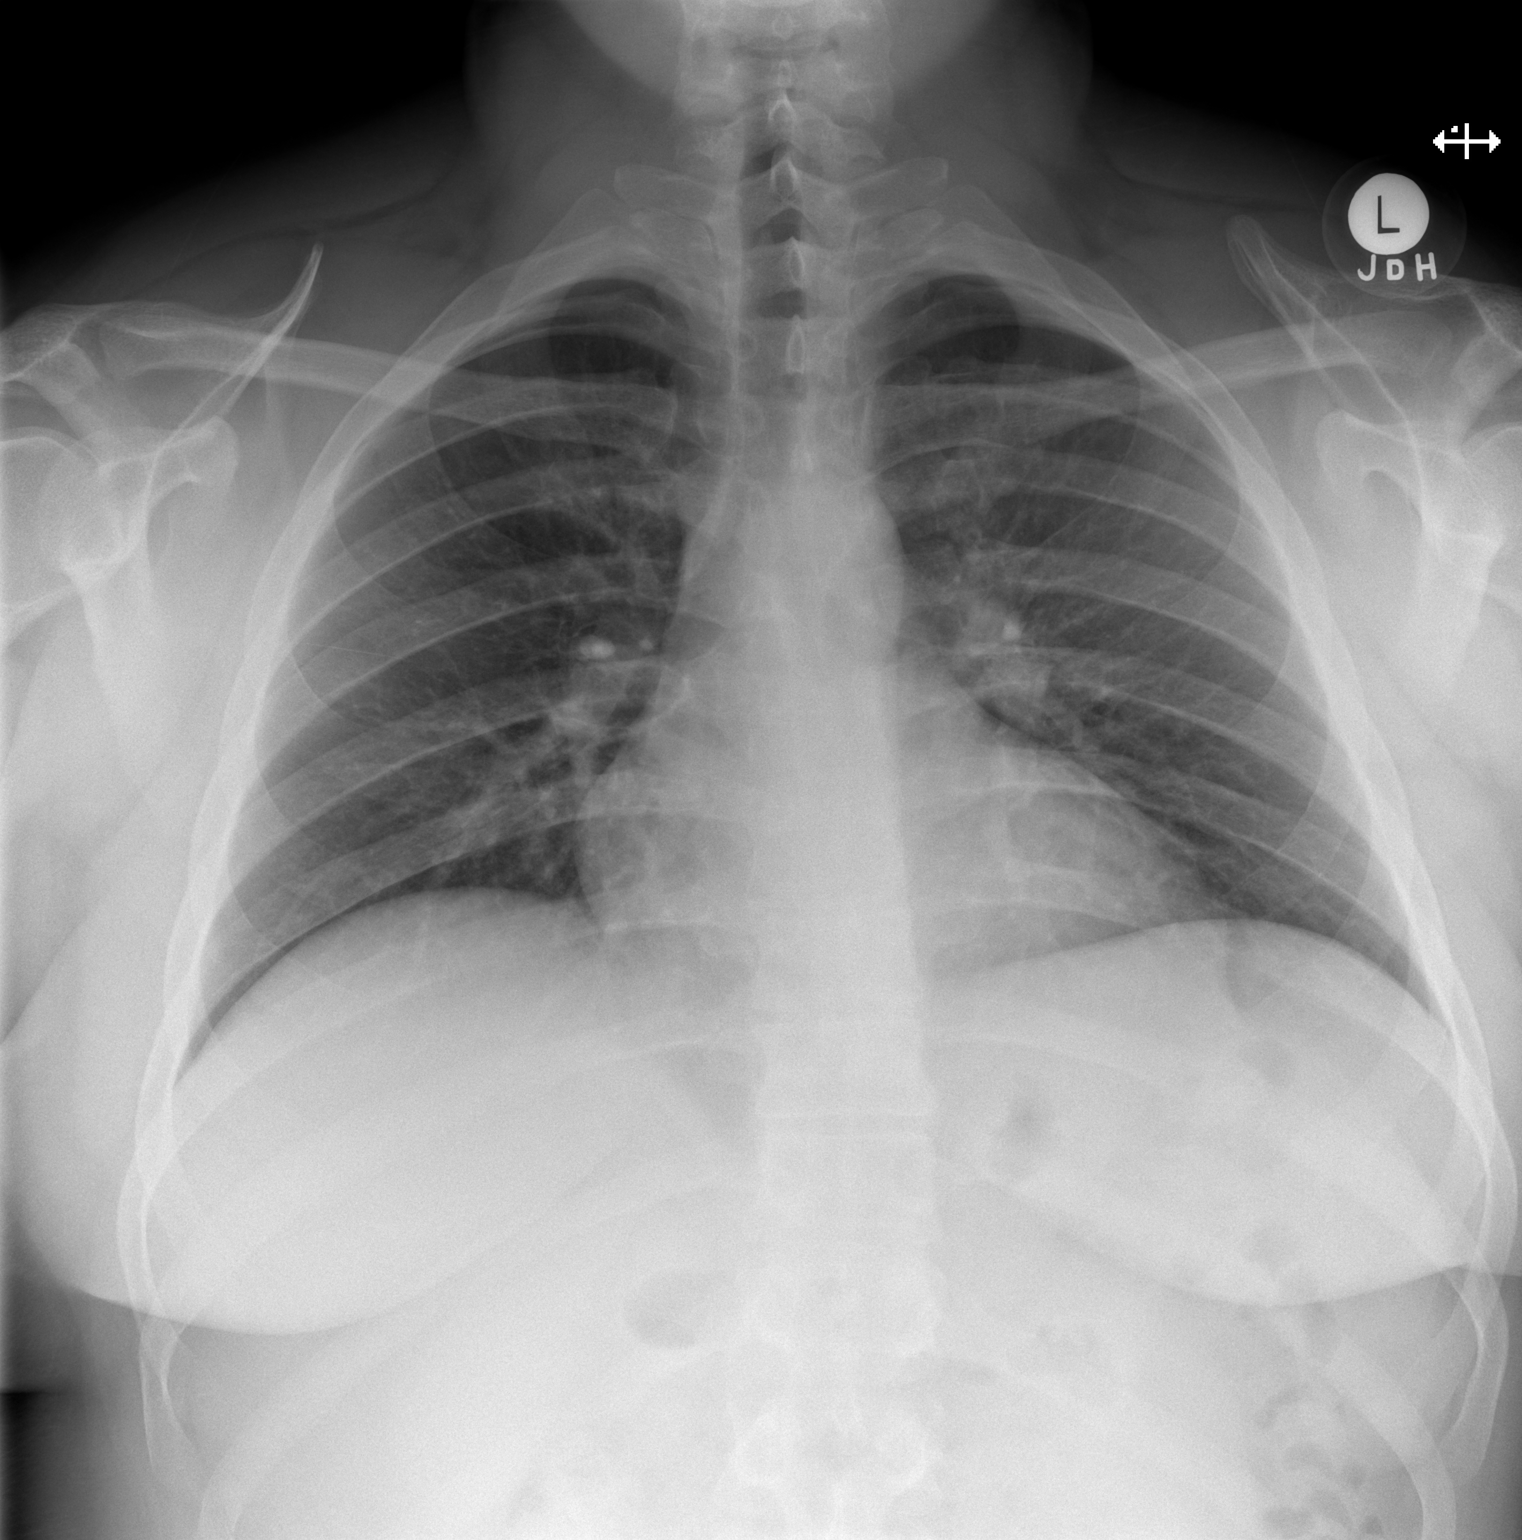

[w chest lat]
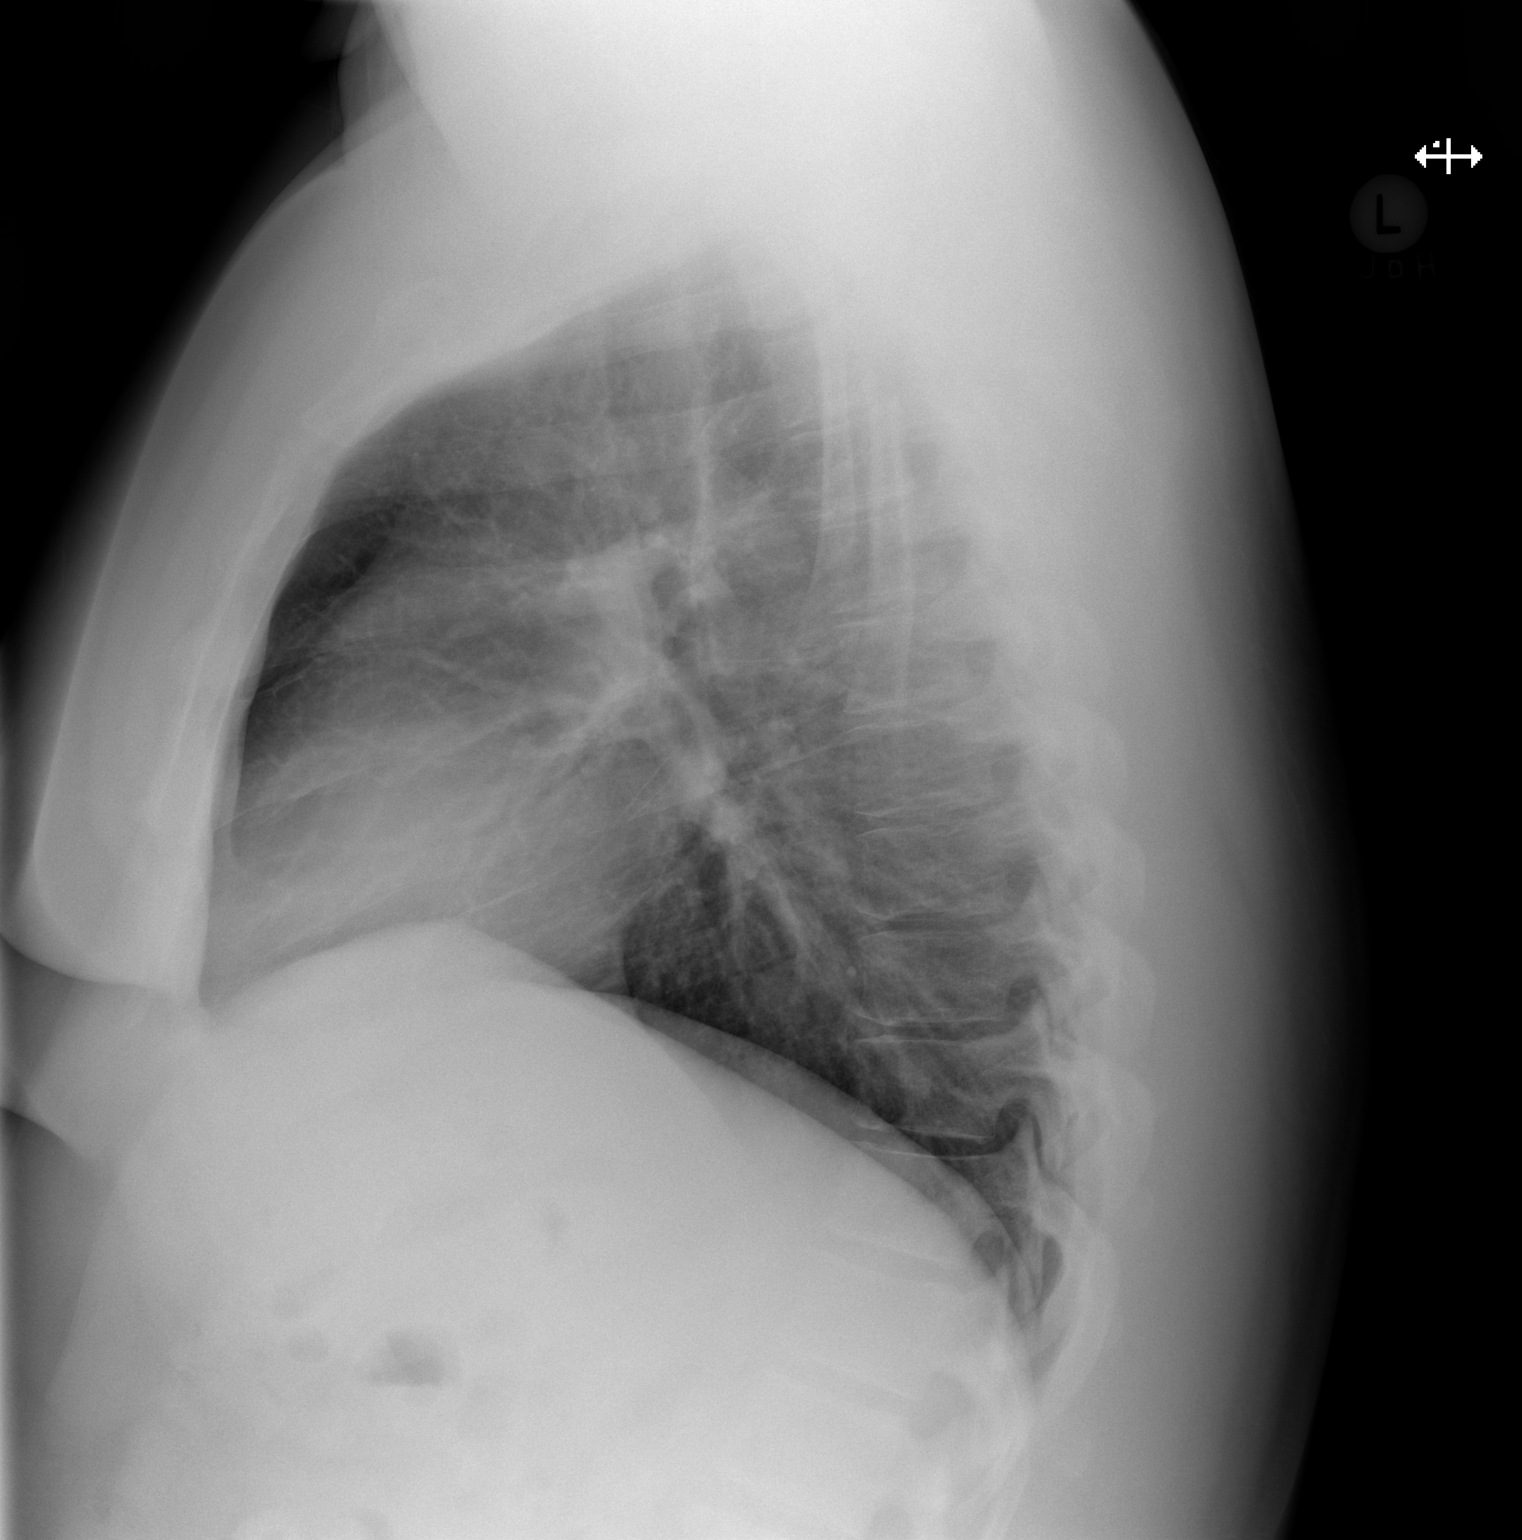

[2 of 2 positions shown; findings below may reference images not displayed]

FINDINGS: The heart size and mediastinal contours are within normal limits.
Both lungs are clear. The visualized skeletal structures are
unremarkable.
IMPRESSION: No active cardiopulmonary disease.

## 2021-10-30 ENCOUNTER — Encounter: Payer: Self-pay | Admitting: Internal Medicine

## 2021-12-03 ENCOUNTER — Encounter: Payer: Self-pay | Admitting: Internal Medicine
